# Patient Record
Sex: Female | Born: 1975 | Race: Black or African American | Hispanic: No | Marital: Married
Health system: Southern US, Community
[De-identification: ages and names within clinical notes are randomized; demographics above are authoritative.]

## PROBLEM LIST (undated history)

## (undated) DIAGNOSIS — E079 Disorder of thyroid, unspecified: Secondary | ICD-10-CM

## (undated) HISTORY — DX: Disorder of thyroid, unspecified: E07.9

## (undated) HISTORY — PX: NO PAST SURGERIES: SHX2092

---

## 2005-07-31 ENCOUNTER — Emergency Department: Payer: Self-pay | Admitting: Emergency Medicine

## 2005-10-22 ENCOUNTER — Emergency Department: Payer: Self-pay | Admitting: Emergency Medicine

## 2005-10-22 ENCOUNTER — Other Ambulatory Visit: Payer: Self-pay

## 2007-04-27 ENCOUNTER — Ambulatory Visit: Payer: Self-pay | Admitting: Internal Medicine

## 2007-07-11 IMAGING — CT CT HEAD WITHOUT CONTRAST
2 series · 16 of 30 positions shown, 20 images · non-contrast
Comparison: none

REASON FOR EXAM: HEADACHE
COMMENTS:

[Series 2: without · axial · non-contrast · 0.39mm/px · z∈[-146,-26]mm · 13 of 28 slices shown, 17 images]
[im 2/28  brain]
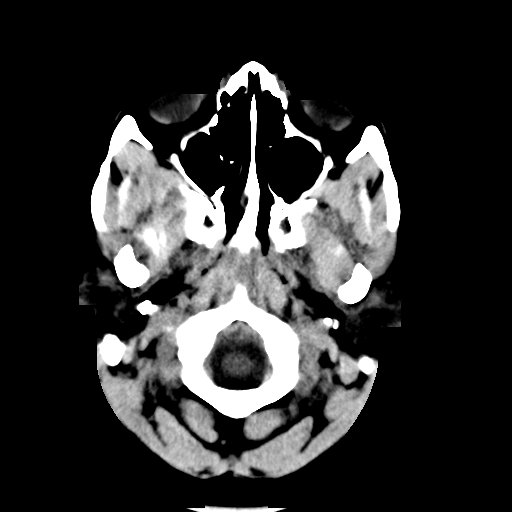
[im 2/28  bone]
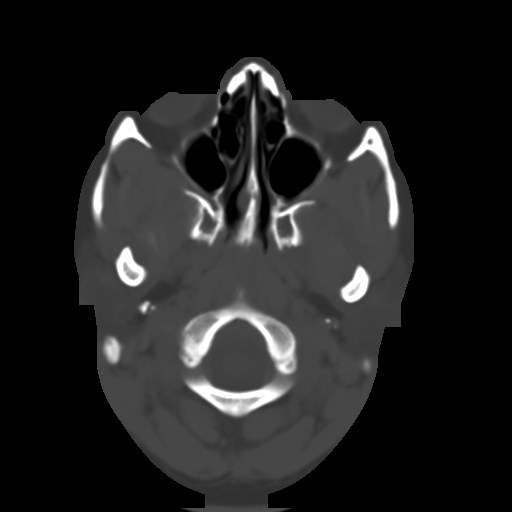
[im 4/28  brain]
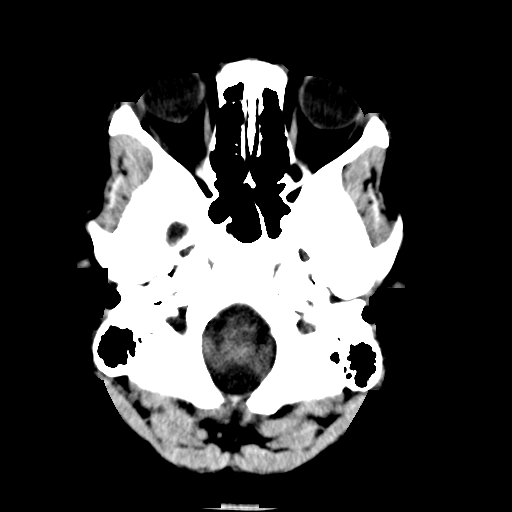
[im 6/28  brain]
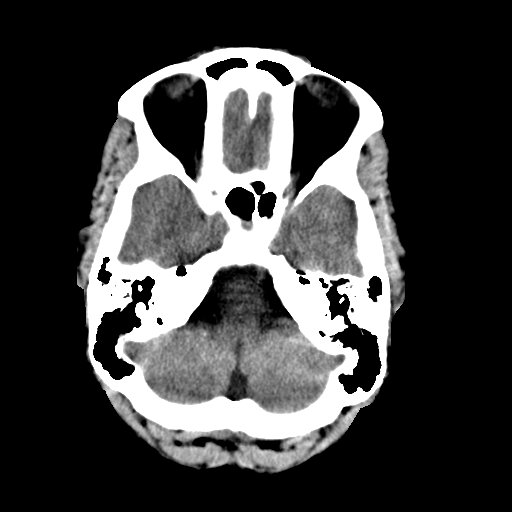
[im 8/28  brain]
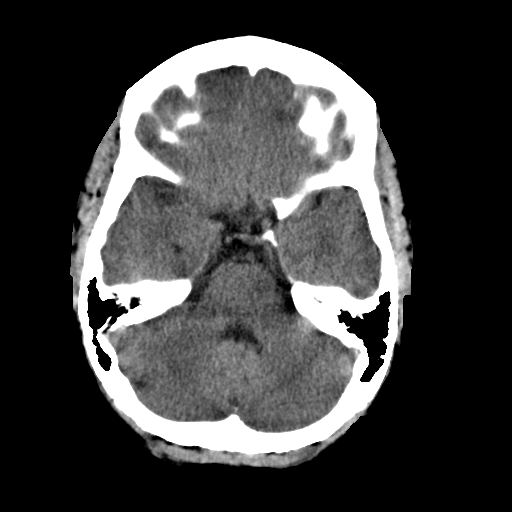
[im 10/28  brain]
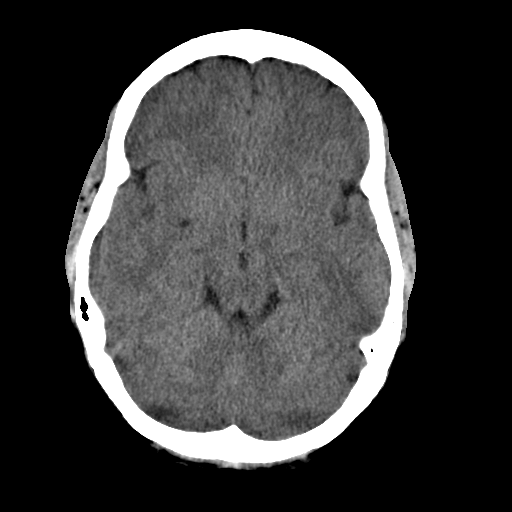
[im 10/28  bone]
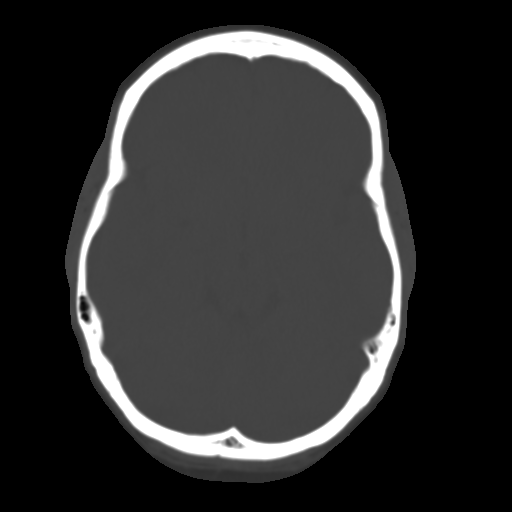
[im 12/28  brain]
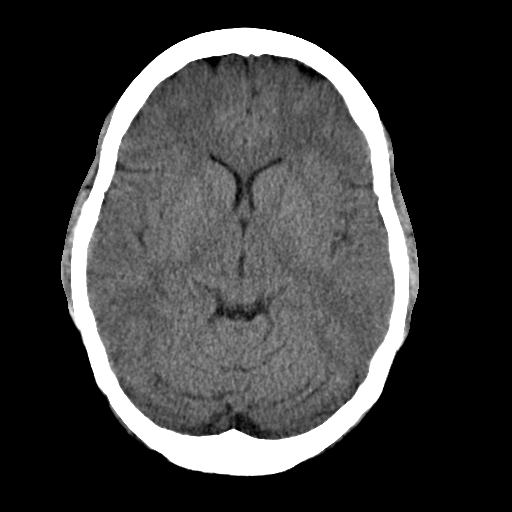
[im 14/28  brain]
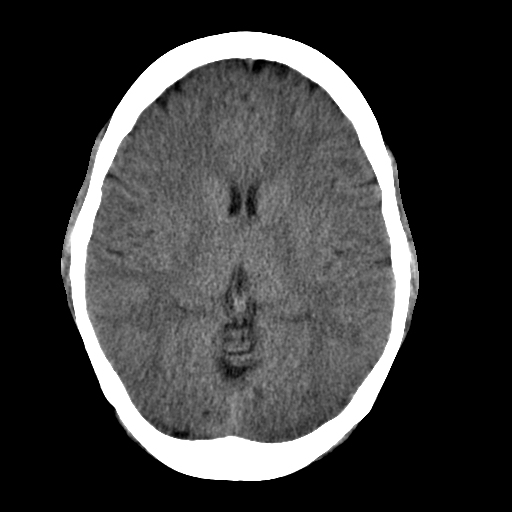
[im 16/28  brain]
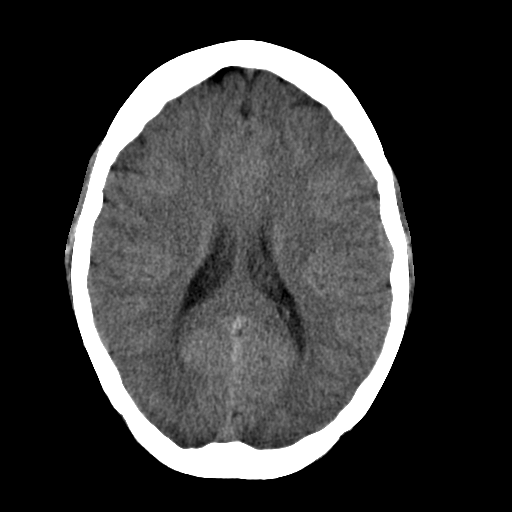
[im 18/28  brain]
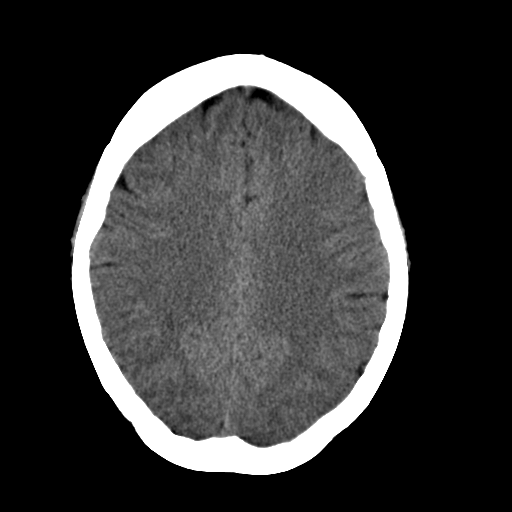
[im 18/28  bone]
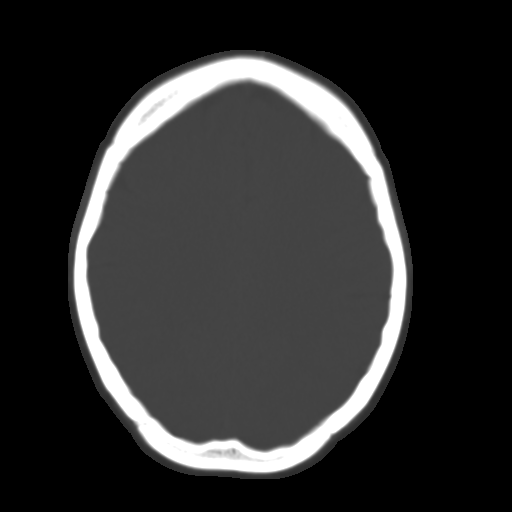
[im 20/28  brain]
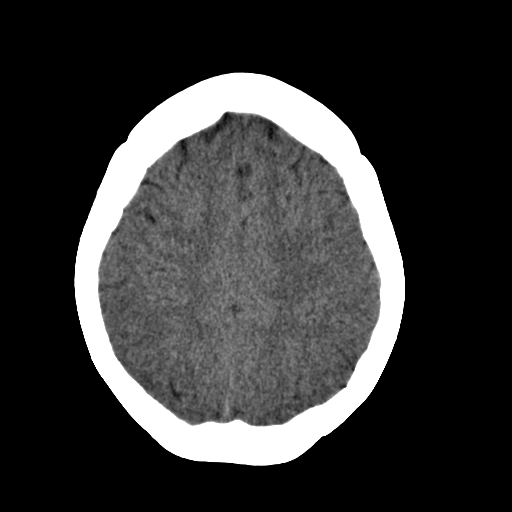
[im 22/28  brain]
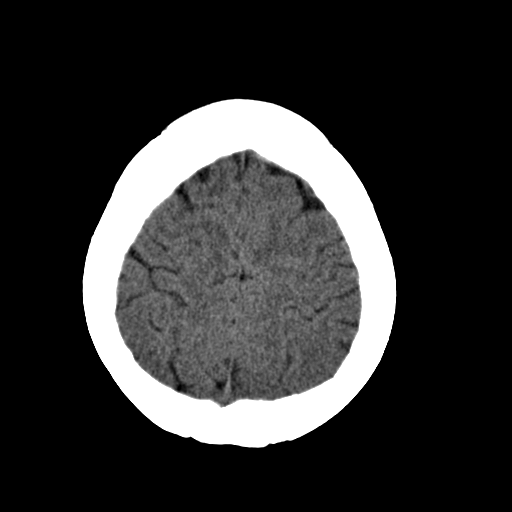
[im 24/28  brain]
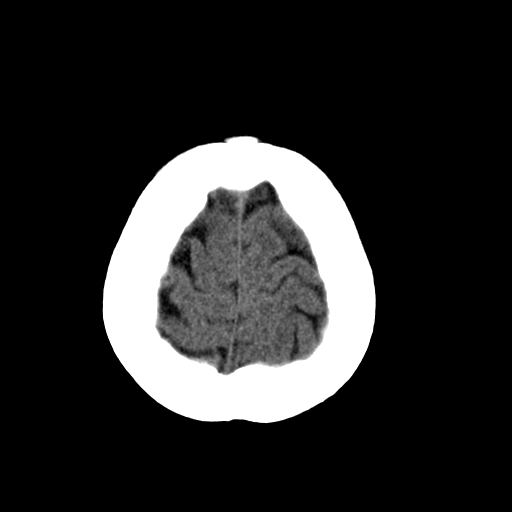
[im 26/28  brain]
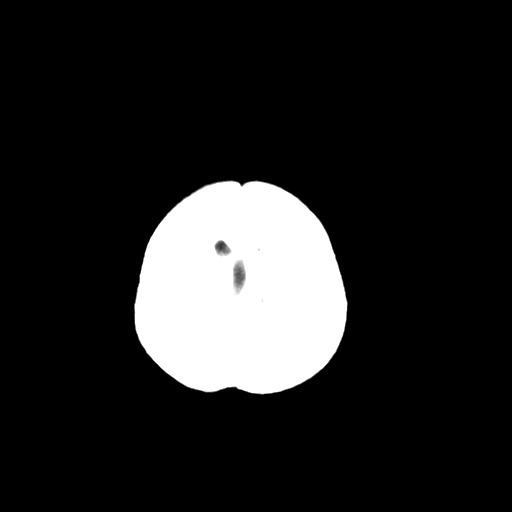
[im 26/28  bone]
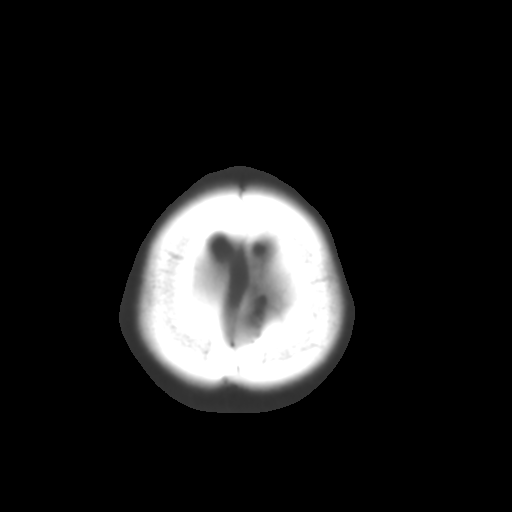

[Series 3: bone · axial · 0.39mm/px · z∈[-146,-106]mm · 3 of 28 slices shown]
[im 2/28  bone]
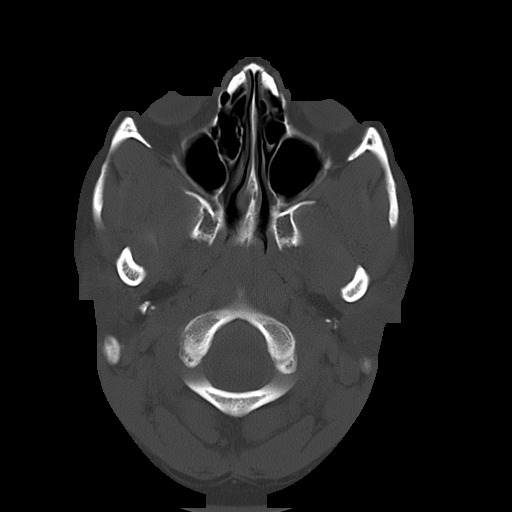
[im 6/28  bone]
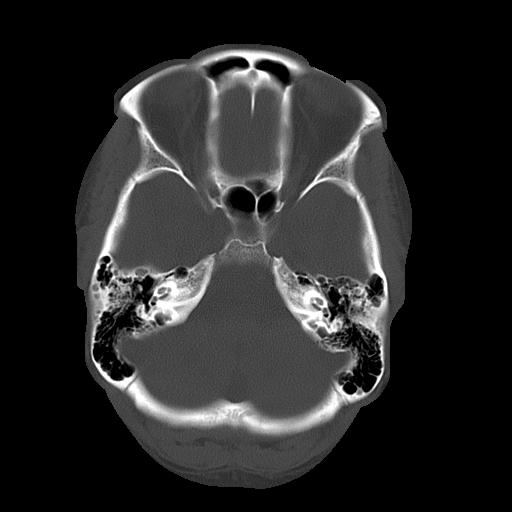
[im 10/28  bone]
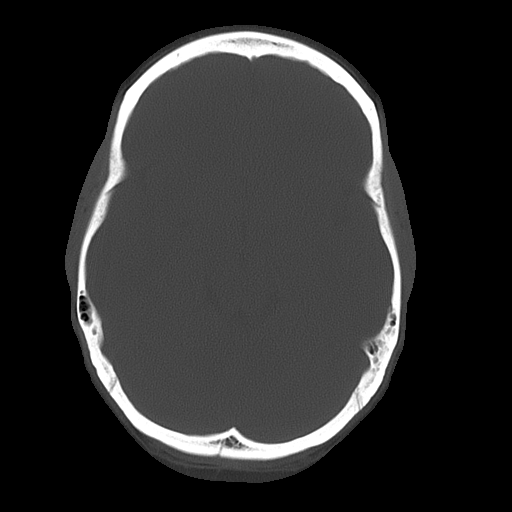

[16 of 30 positions shown; findings below may reference images not displayed]

PROCEDURE:     CT  - CT HEAD WITHOUT CONTRAST  - July 31, 2005 [DATE]

RESULT:     No intraaxial or extraaxial pathologic fluid or blood
collections are identified.  No mass lesions are noted.  There is no
hydrocephalus.  No bony abnormalities are identified.  The initial report
was faxed to the emergency room at the time of the study.
IMPRESSION: No acute abnormality is identified.

## 2008-01-12 ENCOUNTER — Ambulatory Visit: Payer: Self-pay | Admitting: Internal Medicine

## 2009-12-22 IMAGING — US US THYROID
1 series · 17 of 25 positions shown · non-contrast
Comparison: none

REASON FOR EXAM: hypothyroidism
COMMENTS:

PROCEDURE:     US  - US THYROID  - January 12, 2008 [DATE]
RESULT:     The RIGHT thyroid measures 5.06 cm x 1.39 cm x 1.65 cm and the
LEFT lobe measures 4.41 cm x 1.72 cm x 1.53 cm. The thyroid echotexture is
homogeneous. No thyroid nodules are seen.

[Series 1: us thyroid · 17 of 35 slices shown]
[im 1/35]
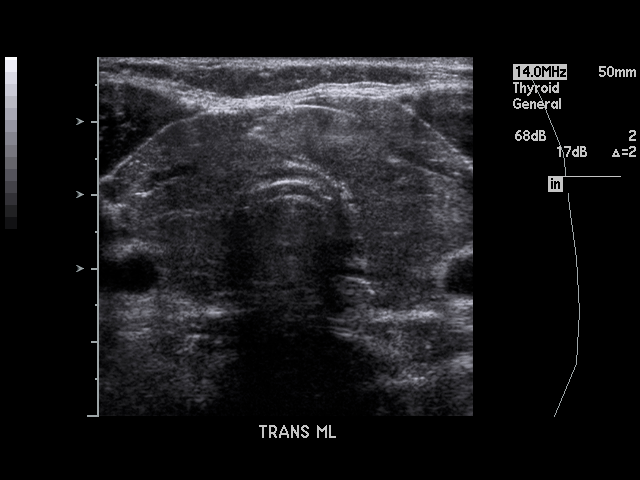
[im 3/35]
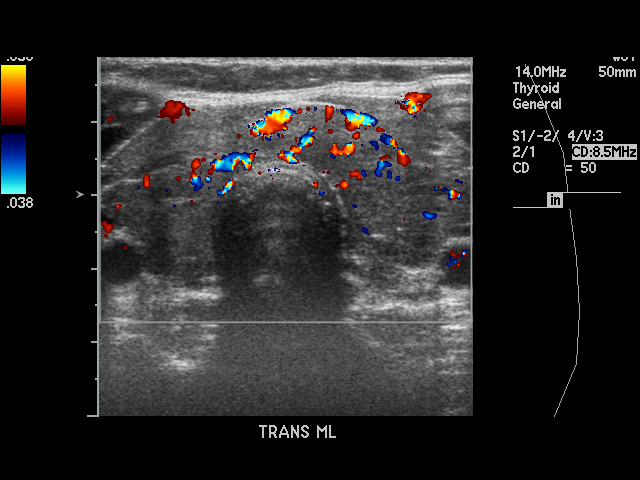
[im 5/35]
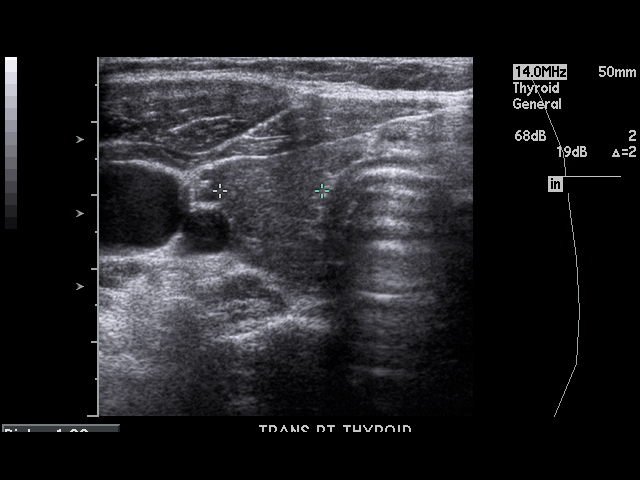
[im 8/35]
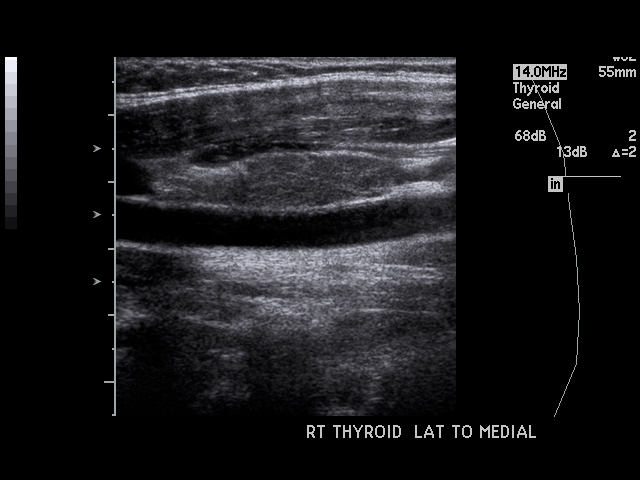
[im 9/35]
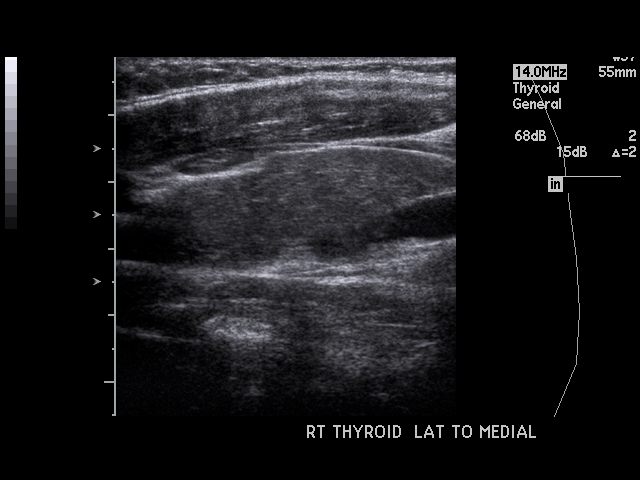
[im 12/35]
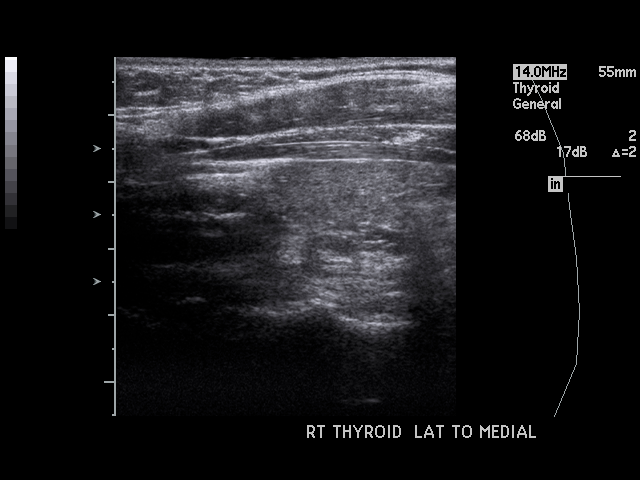
[im 13/35]
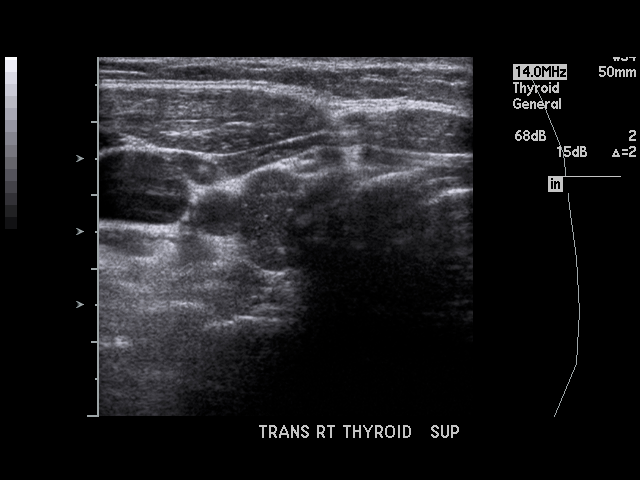
[im 16/35]
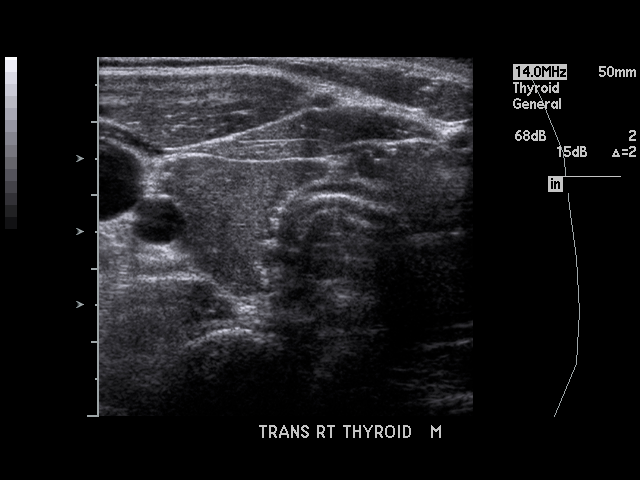
[im 18/35]
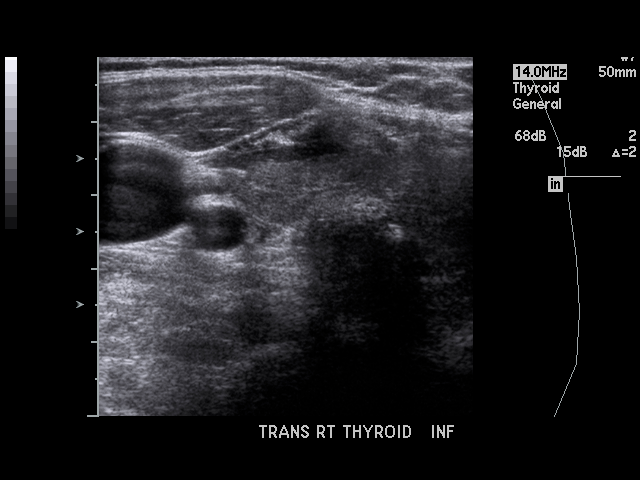
[im 19/35]
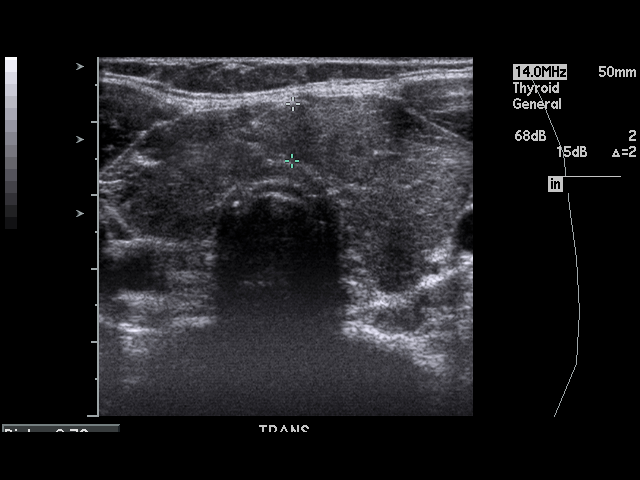
[im 22/35]
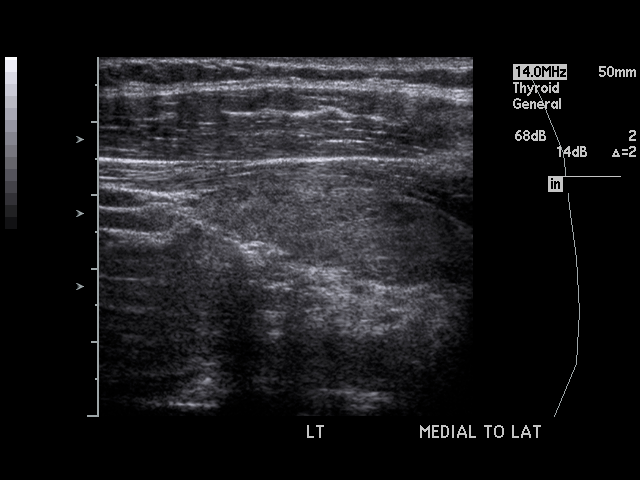
[im 23/35]
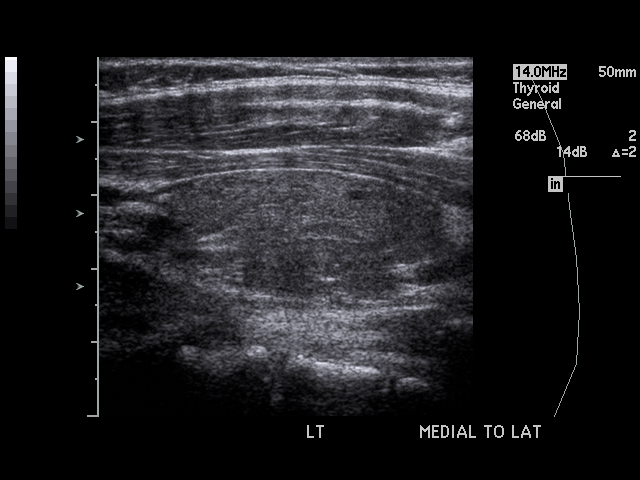
[im 26/35]
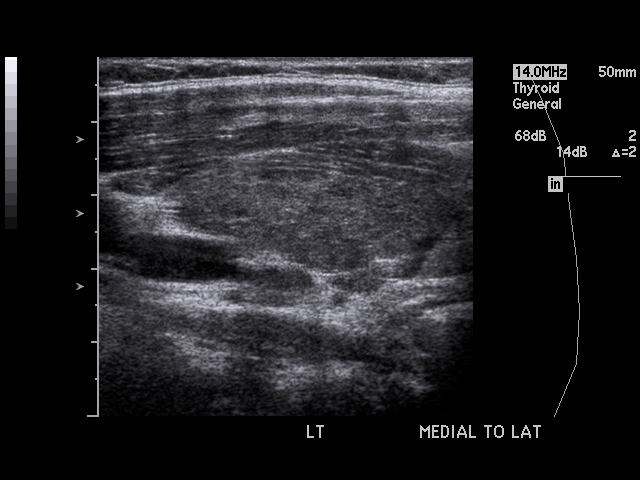
[im 27/35]
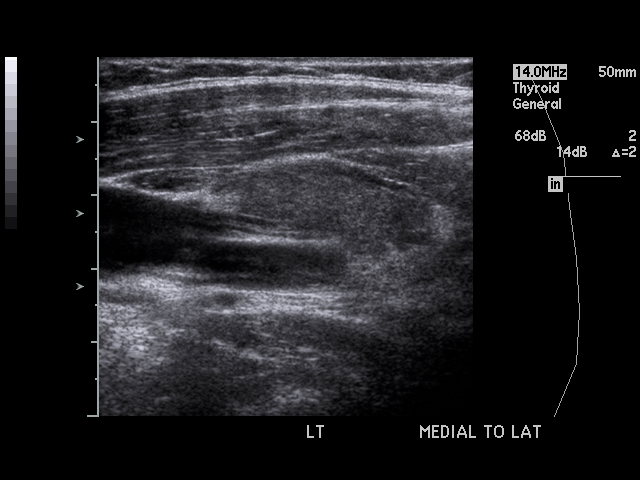
[im 30/35]
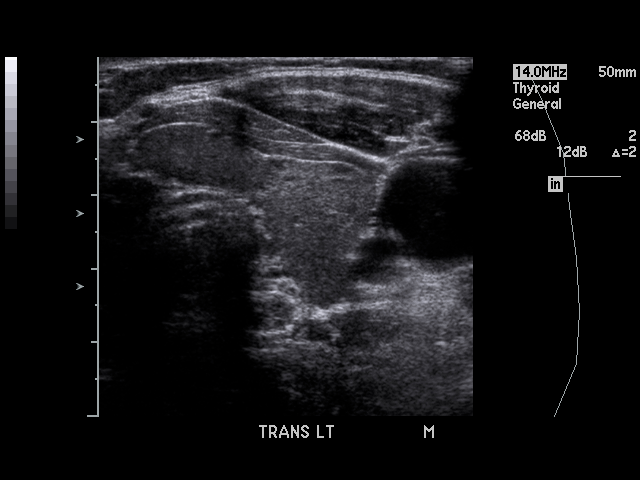
[im 32/35]
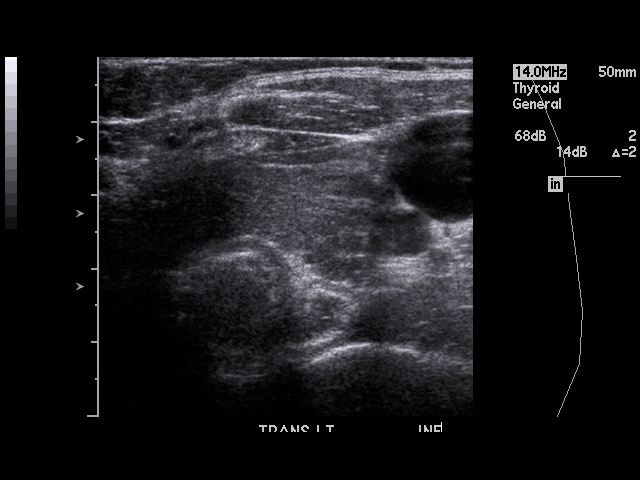
[im 35/35]
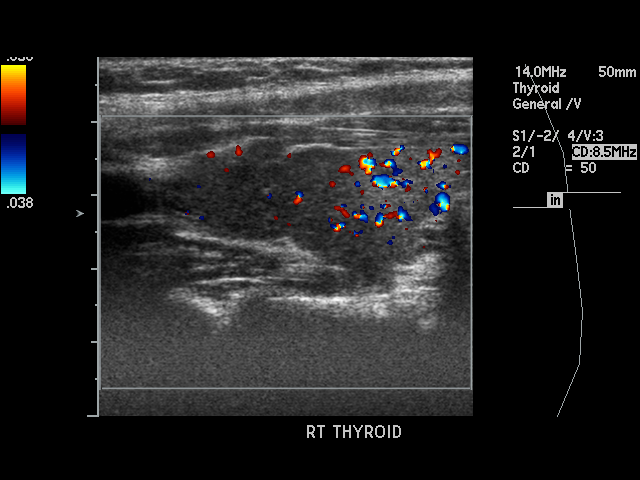

[17 of 25 positions shown; findings below may reference images not displayed]

IMPRESSION: 1. The RIGHT lobe of the thyroid is slightly larger than the LEFT. No
thyroid masses or nodules are seen.
2. Thyroid echotexture is homogeneous.
3. No significant abnormalities are noted.

## 2017-08-13 ENCOUNTER — Encounter: Payer: Self-pay | Admitting: Certified Nurse Midwife

## 2017-08-13 ENCOUNTER — Other Ambulatory Visit: Payer: 59

## 2017-08-13 ENCOUNTER — Ambulatory Visit (INDEPENDENT_AMBULATORY_CARE_PROVIDER_SITE_OTHER): Payer: 59 | Admitting: Certified Nurse Midwife

## 2017-08-13 ENCOUNTER — Other Ambulatory Visit (HOSPITAL_COMMUNITY)
Admission: RE | Admit: 2017-08-13 | Discharge: 2017-08-13 | Disposition: A | Discharge status: Home / Self Care | Payer: 59 | Source: Ambulatory Visit | Attending: Certified Nurse Midwife | Admitting: Certified Nurse Midwife

## 2017-08-13 ENCOUNTER — Encounter (INDEPENDENT_AMBULATORY_CARE_PROVIDER_SITE_OTHER): Payer: Self-pay

## 2017-08-13 VITALS — BP 113/71 | HR 66 | Ht 64.0 in | Wt 221.2 lb

## 2017-08-13 DIAGNOSIS — Z01419 Encounter for gynecological examination (general) (routine) without abnormal findings: Secondary | ICD-10-CM

## 2017-08-13 DIAGNOSIS — N912 Amenorrhea, unspecified: Secondary | ICD-10-CM

## 2017-08-13 DIAGNOSIS — R4586 Emotional lability: Secondary | ICD-10-CM

## 2017-08-13 DIAGNOSIS — Z1231 Encounter for screening mammogram for malignant neoplasm of breast: Secondary | ICD-10-CM

## 2017-08-13 DIAGNOSIS — Z6837 Body mass index (BMI) 37.0-37.9, adult: Secondary | ICD-10-CM | POA: Diagnosis not present

## 2017-08-13 DIAGNOSIS — E063 Autoimmune thyroiditis: Secondary | ICD-10-CM

## 2017-08-13 DIAGNOSIS — Z124 Encounter for screening for malignant neoplasm of cervix: Secondary | ICD-10-CM | POA: Diagnosis not present

## 2017-08-13 DIAGNOSIS — R6889 Other general symptoms and signs: Secondary | ICD-10-CM

## 2017-08-13 DIAGNOSIS — Z1239 Encounter for other screening for malignant neoplasm of breast: Secondary | ICD-10-CM

## 2017-08-13 DIAGNOSIS — Z3169 Encounter for other general counseling and advice on procreation: Secondary | ICD-10-CM

## 2017-08-13 NOTE — Progress Notes (Signed)
New pt is here for an annual exam. LMP: 06/27/2017.

## 2017-08-13 NOTE — Patient Instructions (Addendum)
Preventive Care 40-64 Years, Female Preventive care refers to lifestyle choices and visits with your health care provider that can promote health and wellness. What does preventive care include?  A yearly physical exam. This is also called an annual well check.  Dental exams once or twice a year.  Routine eye exams. Ask your health care provider how often you should have your eyes checked.  Personal lifestyle choices, including: ? Daily care of your teeth and gums. ? Regular physical activity. ? Eating a healthy diet. ? Avoiding tobacco and drug use. ? Limiting alcohol use. ? Practicing safe sex. ? Taking low-dose aspirin daily starting at age 58. ? Taking vitamin and mineral supplements as recommended by your health care provider. What happens during an annual well check? The services and screenings done by your health care provider during your annual well check will depend on your age, overall health, lifestyle risk factors, and family history of disease. Counseling Your health care provider may ask you questions about your:  Alcohol use.  Tobacco use.  Drug use.  Emotional well-being.  Home and relationship well-being.  Sexual activity.  Eating habits.  Work and work Statistician.  Method of birth control.  Menstrual cycle.  Pregnancy history.  Screening You may have the following tests or measurements:  Height, weight, and BMI.  Blood pressure.  Lipid and cholesterol levels. These may be checked every 5 years, or more frequently if you are over 81 years old.  Skin check.  Lung cancer screening. You may have this screening every year starting at age 78 if you have a 30-pack-year history of smoking and currently smoke or have quit within the past 15 years.  Fecal occult blood test (FOBT) of the stool. You may have this test every year starting at age 65.  Flexible sigmoidoscopy or colonoscopy. You may have a sigmoidoscopy every 5 years or a colonoscopy  every 10 years starting at age 30.  Hepatitis C blood test.  Hepatitis B blood test.  Sexually transmitted disease (STD) testing.  Diabetes screening. This is done by checking your blood sugar (glucose) after you have not eaten for a while (fasting). You may have this done every 1-3 years.  Mammogram. This may be done every 1-2 years. Talk to your health care provider about when you should start having regular mammograms. This may depend on whether you have a family history of breast cancer.  BRCA-related cancer screening. This may be done if you have a family history of breast, ovarian, tubal, or peritoneal cancers.  Pelvic exam and Pap test. This may be done every 3 years starting at age 80. Starting at age 36, this may be done every 5 years if you have a Pap test in combination with an HPV test.  Bone density scan. This is done to screen for osteoporosis. You may have this scan if you are at high risk for osteoporosis.  Discuss your test results, treatment options, and if necessary, the need for more tests with your health care provider. Vaccines Your health care provider may recommend certain vaccines, such as:  Influenza vaccine. This is recommended every year.  Tetanus, diphtheria, and acellular pertussis (Tdap, Td) vaccine. You may need a Td booster every 10 years.  Varicella vaccine. You may need this if you have not been vaccinated.  Zoster vaccine. You may need this after age 5.  Measles, mumps, and rubella (MMR) vaccine. You may need at least one dose of MMR if you were born in  1958 or later. You may also need a second dose.  Pneumococcal 13-valent conjugate (PCV13) vaccine. You may need this if you have certain conditions and were not previously vaccinated.  Pneumococcal polysaccharide (PPSV23) vaccine. You may need one or two doses if you smoke cigarettes or if you have certain conditions.  Meningococcal vaccine. You may need this if you have certain  conditions.  Hepatitis A vaccine. You may need this if you have certain conditions or if you travel or work in places where you may be exposed to hepatitis A.  Hepatitis B vaccine. You may need this if you have certain conditions or if you travel or work in places where you may be exposed to hepatitis B.  Haemophilus influenzae type b (Hib) vaccine. You may need this if you have certain conditions.  Talk to your health care provider about which screenings and vaccines you need and how often you need them. This information is not intended to replace advice given to you by your health care provider. Make sure you discuss any questions you have with your health care provider. Document Released: 01/26/2015 Document Revised: 09/19/2015 Document Reviewed: 10/31/2014 Elsevier Interactive Patient Education  2018 [employer].  Preparing for Pregnancy If you are considering becoming pregnant, make an appointment to see your regular health care provider to learn how to prepare for a safe and healthy pregnancy (preconception care). During a preconception care visit, your health care provider will:  Do a complete physical exam, including a Pap test.  Take a complete medical history.  Give you information, answer your questions, and help you resolve problems.  Preconception checklist Medical history  Tell your health care provider about any current or past medical conditions. Your pregnancy or your ability to become pregnant may be affected by chronic conditions, such as diabetes, chronic hypertension, and thyroid problems.  Include your family's medical history as well as your partner's medical history.  Tell your health care provider about any history of STIs (sexually transmitted infections).These can affect your pregnancy. In some cases, they can be passed to your baby. Discuss any concerns that you have about STIs.  If indicated, discuss the benefits of genetic testing. This testing will  show whether there are any genetic conditions that may be passed from you or your partner to your baby.  Tell your health care provider about: ? Any problems you have had with conception or pregnancy. ? Any medicines you take. These include vitamins, herbal supplements, and over-the-counter medicines. ? Your history of immunizations. Discuss any vaccinations that you may need.  Diet  Ask your health care provider what to include in a healthy diet that has a balance of nutrients. This is especially important when you are pregnant or preparing to become pregnant.  Ask your health care provider to help you reach a healthy weight before pregnancy. ? If you are overweight, you may be at higher risk for certain complications, such as high blood pressure, diabetes, and preterm birth. ? If you are underweight, you are more likely to have a baby who has a low birth weight.  Lifestyle, work, and home  Let your health care provider know: ? About any lifestyle habits that you have, such as alcohol use, drug use, or smoking. ? About recreational activities that may put you at risk during pregnancy, such as downhill skiing and certain exercise programs. ? Tell your health care provider about any international travel, especially any travel to places with an active [place] virus outbreak. ?  About harmful substances that you may be exposed to at work or at home. These include chemicals, pesticides, radiation, or even litter boxes. ? If you do not feel safe at home.  Mental health  Tell your health care provider about: ? Any history of mental health conditions, including feelings of depression, sadness, or anxiety. ? Any medicines that you take for a mental health condition. These include herbs and supplements.  Home instructions to prepare for pregnancy Lifestyle  Eat a balanced diet. This includes fresh fruits and vegetables, whole grains, lean meats, low-fat dairy products, healthy fats, and foods  that are high in fiber. Ask to meet with a nutritionist or registered dietitian for assistance with meal planning and goals.  Get regular exercise. Try to be active for at least 30 minutes a day on most days of the week. Ask your health care provider which activities are safe during pregnancy.  Do not use any products that contain nicotine or tobacco, such as cigarettes and e-cigarettes. If you need help quitting, ask your health care provider.  Do not drink alcohol.  Do not take illegal drugs.  Maintain a healthy weight. Ask your health care provider what weight range is right for you.  General instructions  Keep an accurate record of your menstrual periods. This makes it easier for your health care provider to determine your baby's due date.  Begin taking prenatal vitamins and folic acid supplements daily as directed by your health care provider.  Manage any chronic conditions, such as high blood pressure and diabetes, as told by your health care provider. This is important.  How do I know that I am pregnant? You may be pregnant if you have been sexually active and you miss your period. Symptoms of early pregnancy include:  Mild cramping.  Very light vaginal bleeding (spotting).  Feeling unusually tired.  Nausea and vomiting (morning sickness).  If you have any of these symptoms and you suspect that you might be pregnant, you can take a home pregnancy test. These tests check for a hormone in your urine (human chorionic gonadotropin, or hCG). A woman's body begins to make this hormone during early pregnancy. These tests are very accurate. Wait until at least the first day after you miss your period to take one. If the test shows that you are pregnant (you get a positive result), call your health care provider to make an appointment for prenatal care. What should I do if I become pregnant?  Make an appointment with your health care provider as soon as you suspect you are  pregnant.  Do not use any products that contain nicotine, such as cigarettes, chewing tobacco, and e-cigarettes. If you need help quitting, ask your health care provider.  Do not drink alcoholic beverages. Alcohol is related to a number of birth defects.  Avoid toxic odors and chemicals.  You may continue to have sexual intercourse if it does not cause pain or other problems, such as vaginal bleeding. This information is not intended to replace advice given to you by your health care provider. Make sure you discuss any questions you have with your health care provider. Document Released: 12/13/2007 Document Revised: 08/28/2015 Document Reviewed: 07/22/2015 Elsevier Interactive Patient Education  [employer].

## 2017-08-13 NOTE — Progress Notes (Signed)
ANNUAL PREVENTATIVE CARE GYN  ENCOUNTER NOTE  Subjective:       Lindsay Winters is a 42 y.o. G0P0 female here for a routine annual gynecologic exam.  Current complaints: 1. Needs Pap 2. Needs Mammogram 3. Desires annual labs 4. Amenorrhea-reports regular periods until two (2) months ago 5. Forgetfulness 6. Mood swings  New patient here for annual exam. She is accompanied by her spouse. History significant for thyroid disease that she "manages herself". Reports forgetfulness, mood swings and amenorrhea for the last two (2) months.   Patient and spouse are not opposed to pregnancy at this time.    Denies difficulty breathing or respiratory distress, chest pain, abdominal pain, excessive vaginal bleeding, dysuria, and leg pain or swelling.   Gynecologic History  Patient's last menstrual period was 06/17/2017 (approximate). Period Duration (Days): Three (3) Period Pattern: Regular Menstrual Flow: Light Menstrual Control: Other (Comment)(Cup) Dysmenorrhea: (!) Mild Dysmenorrhea Symptoms: Cramping  Contraception: none  Last Pap: unsure.   Last mammogram: 3 years ago. Results were: abnormal; patient did not follow up  Obstetric History  OB History  Gravida Para Term Preterm AB Living  0 0 0 0 0 0  SAB TAB Ectopic Multiple Live Births  0 0 0 0 0    Past Medical History:  Diagnosis Date  . Thyroid disease     Past Surgical History:  Procedure Laterality Date  . NO PAST SURGERIES      Current Outpatient Medications on File Prior to Visit  Medication Sig Dispense Refill  . Multiple Vitamin (MULTIVITAMIN) tablet Take 1 tablet by mouth daily.     No current facility-administered medications on file prior to visit.     Allergies  Allergen Reactions  . Sudafed [Pseudoephedrine Hcl]     Social History   Socioeconomic History  . Marital status: Married    Spouse name: Not on file  . Number of children: Not on file  . Years of education: Not on file  . Highest  education level: Not on file  Occupational History  . Not on file  Social Needs  . Financial resource strain: Not on file  . Food insecurity:    Worry: Not on file    Inability: Not on file  . Transportation needs:    Medical: Not on file    Non-medical: Not on file  Tobacco Use  . Smoking status: Not on file  Substance and Sexual Activity  . Alcohol use: Not on file  . Drug use: Not on file  . Sexual activity: Yes    Birth control/protection: None  Lifestyle  . Physical activity:    Days per week: Not on file    Minutes per session: Not on file  . Stress: Not on file  Relationships  . Social connections:    Talks on phone: Not on file    Gets together: Not on file    Attends religious service: Not on file    Active member of club or organization: Not on file    Attends meetings of clubs or organizations: Not on file    Relationship status: Not on file  . Intimate partner violence:    Fear of current or ex partner: Not on file    Emotionally abused: Not on file    Physically abused: Not on file    Forced sexual activity: Not on file  Other Topics Concern  . Not on file  Social History Narrative  . Not on file  Family History  Problem Relation Age of Onset  . Stroke Maternal Grandmother   . Diabetes Maternal Grandfather   . Hypertension Father   . Hypertension Mother   . Stroke Mother     The following portions of the patient's history were reviewed and updated as appropriate: allergies, current medications, past family history, past medical history, past social history, past surgical history and problem list.  Review of Systems  ROS negative except as noted above. Information obtained from patient.    Objective:   BP 113/71   Pulse 66   Ht 5\' 4"  (1.626 m)   Wt 221 lb 3 oz (100.3 kg)   LMP 06/17/2017 (Approximate)   BMI 37.97 kg/m    CONSTITUTIONAL: Well-developed, well-nourished female in no acute distress.   PSYCHIATRIC: Normal mood and affect.  Normal behavior. Normal judgment and thought content.  NEUROLGIC: Alert and oriented to person, place, and time. Normal muscle tone coordination. No cranial nerve deficit noted.  HENT:  Normocephalic, atraumatic, External right and left ear normal.   EYES: Conjunctivae and EOM are normal. Pupils are equal and round.   NECK: Normal range of motion, supple, no masses. Enlarged thyroid.   SKIN: Skin is warm and dry. No rash noted. Not diaphoretic. No erythema. No pallor.  CARDIOVASCULAR: Normal heart rate noted, regular rhythm,  no murmur.  RESPIRATORY: Clear to auscultation bilaterally. Effort and breath sounds normal, no problems with respiration noted.  BREASTS: Symmetric in size. No masses, skin changes, nipple drainage, or lymphadenopathy.  ABDOMEN: Soft, normal bowel sounds, no distention noted.  No tenderness, rebound or guarding. Obese.   PELVIC:  External Genitalia: Normal  Vagina: Normal  Cervix: Normal  Uterus: Normal  Adnexa: Normal   MUSCULOSKELETAL: Normal range of motion. No tenderness.  No cyanosis, clubbing, or edema.  2+ distal pulses.  LYMPHATIC: No Axillary, Supraclavicular, or Inguinal Adenopathy.  Assessment:   Annual gynecologic examination 42 y.o.   Contraception: none   Obesity 1   Problem List Items Addressed This Visit    None    Visit Diagnoses    Well woman exam    -  Primary   Relevant Orders   CBC   Thyroid Panel With TSH   FSH/LH   Estradiol   Comprehensive metabolic panel   Hemoglobin A1c   Lipid panel   Anti mullerian hormone   Cytology - PAP   MM DIGITAL SCREENING BILATERAL   Beta HCG, Quant   BMI 37.0-37.9, adult       Relevant Orders   Thyroid Panel With TSH   Hemoglobin A1c   Lipid panel   Amenorrhea       Relevant Orders   Beta HCG, Quant   Hashimoto's thyroiditis       Relevant Orders   Thyroid Panel With TSH   Screening for cervical cancer       Relevant Orders   Cytology - PAP   Screening for breast cancer        Relevant Orders   MM DIGITAL SCREENING BILATERAL   Forgetfulness       Mood swings       Encounter for preconception consultation       Relevant Orders   Anti mullerian hormone      Plan:   Pap: Pap Co Test  Mammogram: Ordered  Labs: See orders, will contact patient with results  Routine preventative health maintenance measures emphasized: Exercise/Diet/Weight control, Tobacco Warnings, Alcohol/Substance use risks and Stress Management; See AVS  Reviewed red flag symptoms and when to call.   Agrees to scheduled [place] if needed after labs have resulted.   RTC x 1 year for Annual Exam or sooner if needed.    Gunnar Bulla, CNM [facility], [facility]

## 2017-08-14 ENCOUNTER — Encounter: Payer: Self-pay | Admitting: Certified Nurse Midwife

## 2017-08-16 LAB — COMPREHENSIVE METABOLIC PANEL
A/G RATIO: 1.6 (ref 1.2–2.2)
ALT: 15 IU/L (ref 0–32)
AST: 18 IU/L (ref 0–40)
Albumin: 4.2 g/dL (ref 3.5–5.5)
Alkaline Phosphatase: 82 IU/L (ref 39–117)
BILIRUBIN TOTAL: 0.2 mg/dL (ref 0.0–1.2)
BUN/Creatinine Ratio: 13 (ref 9–23)
BUN: 11 mg/dL (ref 6–24)
CHLORIDE: 101 mmol/L (ref 96–106)
CO2: 20 mmol/L (ref 20–29)
Calcium: 9.7 mg/dL (ref 8.7–10.2)
Creatinine, Ser: 0.86 mg/dL (ref 0.57–1.00)
GFR calc Af Amer: 96 mL/min/{1.73_m2} (ref >59)
GFR calc non Af Amer: 83 mL/min/{1.73_m2} (ref >59)
GLUCOSE: 83 mg/dL (ref 65–99)
Globulin, Total: 2.7 g/dL (ref 1.5–4.5)
POTASSIUM: 4.3 mmol/L (ref 3.5–5.2)
Sodium: 138 mmol/L (ref 134–144)
Total Protein: 6.9 g/dL (ref 6.0–8.5)

## 2017-08-16 LAB — FSH/LH
FSH: 11.8 m[IU]/mL
LH: 20.3 m[IU]/mL

## 2017-08-16 LAB — LIPID PANEL
CHOLESTEROL TOTAL: 184 mg/dL (ref 100–199)
Chol/HDL Ratio: 3.1 ratio (ref 0.0–4.4)
HDL: 59 mg/dL (ref >39)
LDL Calculated: 112 mg/dL — ABNORMAL HIGH (ref 0–99)
Triglycerides: 66 mg/dL (ref 0–149)
VLDL Cholesterol Cal: 13 mg/dL (ref 5–40)

## 2017-08-16 LAB — THYROID PANEL WITH TSH
FREE THYROXINE INDEX: 1.8 (ref 1.2–4.9)
T3 Uptake Ratio: 30 % (ref 24–39)
T4, Total: 6.1 ug/dL (ref 4.5–12.0)
TSH: 6.13 u[IU]/mL — ABNORMAL HIGH (ref 0.450–4.500)

## 2017-08-16 LAB — CBC
HEMATOCRIT: 42.3 % (ref 34.0–46.6)
HEMOGLOBIN: 13.9 g/dL (ref 11.1–15.9)
MCH: 31.4 pg (ref 26.6–33.0)
MCHC: 32.9 g/dL (ref 31.5–35.7)
MCV: 96 fL (ref 79–97)
Platelets: 321 10*3/uL (ref 150–450)
RBC: 4.43 x10E6/uL (ref 3.77–5.28)
RDW: 14.2 % (ref 12.3–15.4)
WBC: 5.6 10*3/uL (ref 3.4–10.8)

## 2017-08-16 LAB — ANTI MULLERIAN HORMONE: ANTI-MULLERIAN HORMONE (AMH): <0.015 ng/mL

## 2017-08-16 LAB — HEMOGLOBIN A1C
ESTIMATED AVERAGE GLUCOSE: 123 mg/dL
HEMOGLOBIN A1C: 5.9 % — AB (ref 4.8–5.6)

## 2017-08-16 LAB — ESTRADIOL: Estradiol: 196.4 pg/mL

## 2017-08-16 LAB — BETA HCG QUANT (REF LAB)

## 2017-08-17 LAB — CYTOLOGY - PAP
ADEQUACY: ABSENT
DIAGNOSIS: NEGATIVE
HPV: NOT DETECTED

## 2017-08-25 ENCOUNTER — Encounter: Payer: Self-pay | Admitting: Certified Nurse Midwife

## 2017-08-25 DIAGNOSIS — Z8639 Personal history of other endocrine, nutritional and metabolic disease: Secondary | ICD-10-CM | POA: Insufficient documentation

## 2017-08-25 DIAGNOSIS — R7303 Prediabetes: Secondary | ICD-10-CM | POA: Insufficient documentation

## 2018-08-16 ENCOUNTER — Encounter: Payer: 59 | Admitting: Certified Nurse Midwife
